# Patient Record
Sex: Female | Born: 1965 | Race: White | Hispanic: No | Marital: Married | Smoking: Never smoker
Health system: Southern US, Community
[De-identification: ages and names within clinical notes are randomized; demographics above are authoritative.]

## PROBLEM LIST (undated history)

## (undated) ENCOUNTER — Emergency Department (HOSPITAL_COMMUNITY): Admission: EM | Payer: Self-pay | Source: Home / Self Care

## (undated) DIAGNOSIS — M549 Dorsalgia, unspecified: Secondary | ICD-10-CM

## (undated) DIAGNOSIS — C649 Malignant neoplasm of unspecified kidney, except renal pelvis: Secondary | ICD-10-CM

## (undated) DIAGNOSIS — G8929 Other chronic pain: Secondary | ICD-10-CM

---

## 2012-07-15 ENCOUNTER — Emergency Department (HOSPITAL_COMMUNITY)
Admission: EM | Admit: 2012-07-15 | Discharge: 2012-07-15 | Disposition: A | Discharge status: Home / Self Care | Payer: No Typology Code available for payment source | Attending: Emergency Medicine | Admitting: Emergency Medicine

## 2012-07-15 ENCOUNTER — Encounter (HOSPITAL_COMMUNITY): Payer: Self-pay | Admitting: Emergency Medicine

## 2012-07-15 ENCOUNTER — Emergency Department (HOSPITAL_COMMUNITY): Payer: No Typology Code available for payment source

## 2012-07-15 DIAGNOSIS — G8929 Other chronic pain: Secondary | ICD-10-CM | POA: Insufficient documentation

## 2012-07-15 DIAGNOSIS — M549 Dorsalgia, unspecified: Secondary | ICD-10-CM | POA: Insufficient documentation

## 2012-07-15 DIAGNOSIS — Y9389 Activity, other specified: Secondary | ICD-10-CM | POA: Insufficient documentation

## 2012-07-15 DIAGNOSIS — S139XXA Sprain of joints and ligaments of unspecified parts of neck, initial encounter: Secondary | ICD-10-CM | POA: Insufficient documentation

## 2012-07-15 DIAGNOSIS — Z79899 Other long term (current) drug therapy: Secondary | ICD-10-CM | POA: Insufficient documentation

## 2012-07-15 DIAGNOSIS — Y92410 Unspecified street and highway as the place of occurrence of the external cause: Secondary | ICD-10-CM | POA: Insufficient documentation

## 2012-07-15 HISTORY — DX: Dorsalgia, unspecified: M54.9

## 2012-07-15 HISTORY — DX: Other chronic pain: G89.29

## 2012-07-15 MED ORDER — METHOCARBAMOL 500 MG PO TABS: 500.0000 mg | ORAL_TABLET | Freq: Two times a day (BID) | ORAL | Status: AC

## 2012-07-15 MED ORDER — TRAMADOL HCL 50 MG PO TABS: 50.0000 mg | ORAL_TABLET | Freq: Four times a day (QID) | ORAL | Status: AC | PRN

## 2012-07-15 NOTE — ED Notes (Signed)
[id number]> Expected date:<BR> Expected time:<BR> Means of arrival:<BR> Comments:<BR> Pt in room
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
## 2012-07-16 NOTE — ED Provider Notes (Signed)
Medical screening examination/treatment/procedure(s) were performed by non-physician practitioner and as supervising physician I was immediately available for consultation/collaboration.   Whitney Plunkett, MD 07/16/12 0016 

## 2018-04-14 ENCOUNTER — Ambulatory Visit: Payer: Self-pay

## 2018-04-14 ENCOUNTER — Ambulatory Visit: Payer: BLUE CROSS/BLUE SHIELD | Admitting: Family Medicine

## 2018-04-14 VITALS — BP 132/86 | Ht 63.5 in | Wt 158.0 lb

## 2018-04-14 DIAGNOSIS — M79672 Pain in left foot: Secondary | ICD-10-CM

## 2018-04-14 MED ORDER — GABAPENTIN 100 MG PO CAPS: 100.0000 mg | ORAL_CAPSULE | Freq: Every day | ORAL | 0 refills | Status: DC

## 2018-04-16 DIAGNOSIS — M79672 Pain in left foot: Secondary | ICD-10-CM | POA: Insufficient documentation

## 2018-04-16 NOTE — Assessment & Plan Note (Signed)
Chronic issue, has had pain for months. Seems to be some component of joint pain as well as plantar fascia pain but diagnosis is not exactly clear to me Will start with immobilisation in cam walker and f/u 2 weeks

## 2018-04-16 NOTE — Progress Notes (Signed)
    CHIEF COMPLAINT / HPI:  Left foot, especially heel area, pain for several months. Has used OTC brace, ice, exercises with no result. 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 Right foot is somewhat painful but left is worst. No known injury. No prior issues with feet.  REVIEW OF SYSTEMS: No unusual weight change. No foot numbness. No skin rash. No feber  PERTINENT  PMH / PSH: I have reviewed the patient's medications, allergies, past medical and surgical history, smoking status and updated in the EMR as appropriate. No personal hx of DM No prior foot surgery Never smoker OBJECTIVE:  WD WN NAD FEET:  B pes planus. Left foot ttp over lateral heel.  ANKLE: B from with good endpoint on anterior drawer. Some pain with eversion and inversion resistance on left ankle NEURO: intact sensation to soft touch B feet VASC: DP and PT pulses 2+ bilaterally equal.  ASSESSMENT / PLAN:  No problem-specific Assessment & Plan notes found for this encounter.

## 2018-04-28 ENCOUNTER — Encounter (INDEPENDENT_AMBULATORY_CARE_PROVIDER_SITE_OTHER): Payer: Self-pay

## 2018-04-28 ENCOUNTER — Ambulatory Visit: Payer: BLUE CROSS/BLUE SHIELD | Admitting: Family Medicine

## 2018-04-28 ENCOUNTER — Encounter: Payer: Self-pay | Admitting: Family Medicine

## 2018-04-28 DIAGNOSIS — M79672 Pain in left foot: Secondary | ICD-10-CM

## 2018-04-28 MED ORDER — METHYLPREDNISOLONE ACETATE 40 MG/ML IJ SUSP
40.0000 mg | Freq: Once | INTRAMUSCULAR | Status: AC
  Administered 2018-04-28: 40 mg via INTRA_ARTICULAR

## 2018-04-28 MED ORDER — AMITRIPTYLINE HCL 10 MG PO TABS
10.0000 mg | ORAL_TABLET | Freq: Every day | ORAL | 0 refills | Status: DC
Start: 2018-04-28 — End: 2018-05-12

## 2018-04-28 NOTE — Progress Notes (Signed)
[facility] Attending Note: I have seen and examined this patient. I have discussed this patient with the resident and reviewed the assessment and plan as documented above. I agree with the resident's findings and plan. Pain not much better with 1 week of wearing a Cam walker boot.  Diagnosis still somewhat unclear to me.  We will try corticosteroid injection and amitriptyline.  If this does not improve her symptoms with continued immobilization, will probably need to get an MRI.

## 2018-04-28 NOTE — Progress Notes (Signed)
   CC: L foot pain fu  HPI  Left foot pain: She reports minimal change with a cam walker, wearing as directed.  States that her pain is mostly after standing a lot at her job, as this is been going on about 3 months without any acute injury at the onset.  She thinks maybe she started having pain while she is in physical therapy after bladder sling removal procedure.  She does not have significant pain when first standing in the morning.  She has tried tramadol and gabapentin and feels that neither are helpful for her.  She also uses ibuprofen which is helpful for a few hours in the morning.  It feels like a bruised type of feeling and radiates up her left ankle.  CC, SH/smoking status, and VS noted  Objective: BP 131/82   Ht 5' 3.5" (1.613 m)   Wt 158 lb (71.7 kg)   BMI 27.55 kg/m  Gen: NAD, alert, cooperative, and pleasant. L ankle: Normal strength with inversion eversion and flexion and extension.  No gross abnormalities or edema.  Pain to palpation of the lateral malleolus as well as base of the calcaneus, in addition to over the lateral midfoot.  Some pain with palpation of the plantar fascia as well. Neuro: Alert and oriented, Speech clear, No gross deficits  PROCEDURE: INJECTION: Patient was given informed consent, signed copy in the chart. Appropriate time out was taken. Area prepped and draped in usual sterile fashion. Ethyl chloride was  used for local anesthesia. A 21 gauge 1 1/2 inch needle was used.. 1 cc of methylprednisolone 40 mg/ml plus  1 cc of 1% lidocaine without epinephrine was injected into the plantar fascia using a(n) medial approach.   The patient tolerated the procedure well. There were no complications. Post procedure instructions were given.  Assessment and plan:  Left ankle pain: Continued curious etiology, we discussed with the patient the options of MRI for further information gathering, amitriptyline for possible complex regional pain syndrome, or injection of  the plantar fascia for refractory plantar fascial pain.  Patient is traveling to [place] in 1 week, and given this we will be more aggressive with her treatment.  She elects for amitriptyline as well as plantar fascial injection today.  She may also get an MRI while she is in [place], and she will bring the disc back with with her if she does this.  She should return after her trip if she has continued pain.  Meds ordered this encounter  Medications  . amitriptyline (ELAVIL) 10 MG tablet    Sig: Take 1 tablet (10 mg total) by mouth at bedtime.    Dispense:  90 tablet    Refill:  0  . methylPREDNISolone acetate (DEPO-MEDROL) injection 40 mg     Loni MuseKate Timberlake, MD, PGY3 04/28/2018 11:44 AM

## 2018-05-11 ENCOUNTER — Other Ambulatory Visit: Payer: Self-pay | Admitting: Family Medicine

## 2018-05-12 ENCOUNTER — Ambulatory Visit: Payer: Self-pay

## 2018-05-12 ENCOUNTER — Ambulatory Visit (INDEPENDENT_AMBULATORY_CARE_PROVIDER_SITE_OTHER): Payer: BLUE CROSS/BLUE SHIELD | Admitting: Sports Medicine

## 2018-05-12 VITALS — BP 120/88 | Ht 63.5 in | Wt 155.0 lb

## 2018-05-12 DIAGNOSIS — M79672 Pain in left foot: Secondary | ICD-10-CM

## 2018-05-12 MED ORDER — MELOXICAM 15 MG PO TABS: 15.0000 mg | ORAL_TABLET | Freq: Every day | ORAL | 3 refills | Status: AC | PRN

## 2018-05-12 MED ORDER — AZITHROMYCIN 500 MG PO TABS: ORAL_TABLET | ORAL | 0 refills | Status: AC

## 2018-05-12 NOTE — Progress Notes (Addendum)
HPI  CC: Left foot pain  Jasmine HoughShahida is a 53 year old female presents with left foot pain.  She was last seen on 04/28/2018.  At that time she got an injection into her left plantar fascia.  She states this did help somewhat with her plantar fascial pain.  She now has pain on the lateral side of her heel, which she state was present at her last visit.  She states that pain is now more noticeable.  She notes that anytime she walks for any prolonged period time.  She notices it whenever she takes a first step in the morning.  She states she does have some pain or metatarsal heads still.  She also reports of pain that radiates up the lateral side of her foot behind the lateral malleoli.  She denies any numbness and tingling.  She denies any weakness of the foot.  She has been wearing a boot which does not seem to help much.  She is taken 800 mg of Tylenol daily which seems to help somewhat.  She has not tried any padding in her shoes.  She denies any new trauma to the area.  She has not been doing any home exercises.  See HPI and/or previous note for associated ROS.  Objective: BP 120/88   Ht 5' 3.5" (1.613 m)   Wt 155 lb (70.3 kg)   BMI 27.03 kg/m  Gen:NAD, well groomed, a/o x3, normal affect.  CV: Well-perfused. Warm.  Resp: Non-labored.  Neuro: Sensation intact throughout. No gross coordination deficits.  Gait: Nonpathologic posture, unremarkable stride without signs of limp or balance issues.  Left foot exam: No erythema, warmth, swelling noted.  Tenderness palpation over the posterior aspect of the lateral malleoli.  Tenderness palpation over the lateral heel pad.  Tenderness palpation over the base of the fifth metatarsal.  Full range of motion dorsiflexion, plantarflexion, eversion, inversion.  Some pain with dorsiflexion.  Strength 5 5 throughout testing.  Negative anterior drawer test.  ULTRASOUND: Ankle, left Diagnostic limited ultrasound imaging obtained of patient's left ankle.  -  Lateral tendons: Peroneal tendons observed with evidence of edema.  No obvious tears noted.  No hypoechoic changes noted.. Peroneal tubercle noted and normal.  IMPRESSION: findings consistent with peroneal tendinitis.  Assessment and plan:  1.  Left lateral column overload, likely secondary to underlying plantar fasciitis. 2.  Left-sided plantar fasciitis, improving 3.  Peroneal tendinitis, left side  We discussed treatment options at today's visit.  I will switch her to meloxicam 15 mg daily at this time.  She is leaving for [place] tomorrow for 2 and half weeks.  I will also give her a lateral heel wedge to put in her shoes.  This will help offload the lateral column.  She has a walking boot already from last visit, and I advised her to take this with her she needs it while in [place].  We will see her back when she returns.  At that time she should bring all of her shoes as we discussed the plan for making custom orthotics versus padding of the shoes.  I also provided her with 3 days of azithromycin 500 mg, in case she needs it while in [place].  She has a history of traveler's diarrhea and is unable to get a hold of her primary care before leaving tomorrow.  Alric QuanBlake Mayola Mcbain, MD [facility] Sports Medicine Fellow 05/12/2018 9:29 AM   Patient seen and evaluated with the sports medicine fellow.  I agree with the  above plan of care.  Follow-up in 3 to 4 weeks when returning from [place].

## 2018-05-12 NOTE — Patient Instructions (Signed)
Thank you for coming to see Jasmine Horn today in clinic.  You were seen for left foot pain.  We started you on meloxicam 15 mg at today's visit.  We also gave you some heel wedges to wear new shoes while you are in [place].  We will see for follow-up when she returns from her vacation.

## 2018-06-09 ENCOUNTER — Ambulatory Visit: Payer: BLUE CROSS/BLUE SHIELD | Admitting: Family Medicine

## 2018-07-07 ENCOUNTER — Encounter: Payer: Self-pay | Admitting: Family Medicine

## 2018-07-07 ENCOUNTER — Ambulatory Visit: Payer: BLUE CROSS/BLUE SHIELD | Admitting: Family Medicine

## 2018-07-07 DIAGNOSIS — M79672 Pain in left foot: Secondary | ICD-10-CM

## 2018-07-07 NOTE — Progress Notes (Signed)
[facility]: Attending Note: I have reviewed the chart, discussed wit the Sports Medicine Fellow. I agree with assessment and treatment plan as detailed in the Fellow's note. I agree with MRI as conservative therapy has had no significant effect on her pain.

## 2018-07-07 NOTE — Assessment & Plan Note (Signed)
-  Unclear etiology of her symptoms, differential diagnosis: Chronic plantar fasciitis, chronic peroneal tendinitis, posterior tibial tendon dysfunction, tarsal tunnel syndrome, bony edema of the calcaneus, or alternative etiology -Given that the patient has failed conservative measures for approximately 6 months including: Exercises, multiple medications including anti-inflammatories and amitriptyline, and a corticosteroid injection of her plantar fascia, I am recommending pursuing an MRI to further delineate the etiology of her pain -Temporary orthotics size 7-1/2-8-1/2 were given today with small scaphoid pads.  This will provide medial longitudinal arch support for her. -Two small scaphoid pads were placed in her normal walking shoes as well -Follow-up in 1 week with the results of the MRI -She is a candidate for custom orthotics.  If she likes her temporary orthotics we will consider making her custom ones in the future.

## 2018-07-07 NOTE — Progress Notes (Signed)
Ellea Rodriges - 53 y.o. female MRN [id number]  Date of birth: 12/14/1965   Chief complaint: L plantar foot pain  SUBJECTIVE:    History of present illness: 53 year old female that presents today for follow-up of ongoing foot pain.  She has been seen both in December and January for this problem and has had several different treatment modalities for this.  Back in December, it was thought that her pain was due to plantar fasciitis and she received a cortisone injection of this area.  She reports no significant relief with the cortisone injection.  She was given plantar fascia stretches and gastrocnemius strengthening exercises but continues to have ongoing pain.  She is also evaluated in January where an ultrasound demonstrated mild swelling around her peroneal tendons however no discrete tears.  She was placed on meloxicam as an anti-inflammatory however reports no significant relief in her symptoms.  Today she states her pain is 9 out of 10 and is worse with ambulation first thing in the morning.  She localizes it to her inferior medial calcaneus.  It does seem to loosen up throughout the day however in the morning it is nearly unbearable.  She has been avoiding ambulating long distances secondary to this pain.  She recently traveled to [place] and actually had to have a wheelchair throughout the airport secondary to inability walking.  She also has tried compression stockings which have not worked.  Furthermore, she has failed amitriptyline therapy given her pain and sensation of burning in her entire foot.  Today she does endorse she gets a burning sensation throughout the entire dorsum of her foot.  It does completely resolve however it is intermittent and unpredictable.  She denies any significant ankle tenderness or knee pain.  The pain does occasionally wake her up at night.  She denies any weakness of her extremity.  No injury or trauma to the area.  No new footwear.  She has not recently done  increasing activity.   Review of systems:  As stated above   Interval past medical history, surgical history, family history, and social history obtained and are unchanged.   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  Of note she has a history of chronic low back pain and takes Ultram for this.  He is a non-smoker.  Medications reviewed and unchanged.  Of note, she takes Ultram, meloxicam, gabapentin, Zyrtec, Allegra. Allergies reviewed and unchanged.  Of note, she has intolerance to amitriptyline and an allergy to cinnamon.  OBJECTIVE:  Physical exam: Vital signs are reviewed. BP 127/83   Ht 5\' 3"  (1.6 m)   Wt 155 lb (70.3 kg)   BMI 27.46 kg/m   Gen.: Alert, oriented, appears stated age, in no apparent distress HEENT: Moist oral mucosa Respiratory: Normal respirations, able to speak in full sentences Cardiac: Regular rate, right radial pulses +2 Integumentary: No rashes or skin lesions noted.  No excess callus formation of her feet. Neurologic:  Sensation is intact to light touch L4-S1.  Discomfort elicited with Tinel's over the tarsal tunnel. Gait: Antalgic primarily weightbearing on her hindfoot on the left side with an associated limp. Psych: Normal affect, mood is described as good.  Very pleasant Musculoskeletal: Inspection of the left foot demonstrates no obvious swelling or deformity.  She does have significant pes planus with subtalar medial translation with weightbearing.  She has tenderness palpation in her medial longitudinal arch.  She also has tenderness to palpation over her medial calcaneus at the  referred pain site of plantar fasciitis.  No Achilles tenderness or palpable defects.  No tight gastrocnemius muscles.  Full range of motion ankle dorsiflexion plantarflexion.  Strength testing 5 out of 5 in ankle dorsiflexion plantarflexion.  Pain is elicited with the toe off phase of ambulation indicating posterior tibialis tendon weakness.  Negative anterior drawer.   Negative ankle inversion and eversion testing.  Dorsalis pedis pulse +2.    ASSESSMENT & PLAN: Left foot pain -Unclear etiology of her symptoms, differential diagnosis: Chronic plantar fasciitis, chronic peroneal tendinitis, posterior tibial tendon dysfunction, tarsal tunnel syndrome, bony edema of the calcaneus, or alternative etiology -Given that the patient has failed conservative measures for approximately 6 months including: Exercises, multiple medications including anti-inflammatories and amitriptyline, and a corticosteroid injection of her plantar fascia, I am recommending pursuing an MRI to further delineate the etiology of her pain -Temporary orthotics size 7-1/2-8-1/2 were given today with small scaphoid pads.  This will provide medial longitudinal arch support for her. -Two small scaphoid pads were placed in her normal walking shoes as well -Follow-up in 1 week with the results of the MRI -She is a candidate for custom orthotics.  If she likes her temporary orthotics we will consider making her custom ones in the future.   Orders Placed This Encounter  Procedures  . MR FOOT LEFT WO CONTRAST    Ins bcbs Ht 5'3/Wt 158 No claus/no metal eyes by dr/no shrapnel/no sx left foot, brain, heart, eyes or ears/no implants/no needs/scheduled w patient/taron  Epic order    Standing Status:   Future    Standing Expiration Date:   09/05/2019    Order Specific Question:   What is the patient's sedation requirement?    Answer:   No Sedation    Order Specific Question:   Does the patient have a pacemaker or implanted devices?    Answer:   No    Order Specific Question:   Preferred imaging location?    Answer:   [facility] (table limit-550lbs)    Order Specific Question:   Radiology Contrast Protocol - do NOT remove file path    Answer:   [email or web address]     Gustavus Messing, DO Sports Medicine Fellow [facility]

## 2018-07-12 ENCOUNTER — Telehealth: Payer: Self-pay | Admitting: Family Medicine

## 2018-07-12 ENCOUNTER — Ambulatory Visit
Admission: RE | Admit: 2018-07-12 | Discharge: 2018-07-12 | Disposition: A | Discharge status: Home / Self Care | Payer: BLUE CROSS/BLUE SHIELD | Source: Ambulatory Visit | Attending: Family Medicine | Admitting: Family Medicine

## 2018-07-12 DIAGNOSIS — M79672 Pain in left foot: Secondary | ICD-10-CM

## 2018-07-12 NOTE — Telephone Encounter (Signed)
Jasmine Horn, please let her know that I reviewed her MRI.  She has a small lesion of the talar dome needs to be evaluated by ortho. Not sure if that is contributing to her pain or not, but that definitely needs to be seen by ortho.  I would rec her  to see Dr. Susa Simmonds at [facility] orthopedics.    She has some plantar fasciitis and some tendinitis as well.  Not sure why this is not gotten better with conservative therapy but at this point I think she needs to see the orthopedist anyway.  See if she is okay with that, otherwise I can call her.

## 2018-07-13 NOTE — Telephone Encounter (Signed)
Spoke with patient about her results. She is in agreement with the plan. However, when I tried to schedule an appt with Dr. Susa Simmonds I discovered his office does not accept her insurance. She has the [employer] plan with [facility] so she can only go to a [facility]. Is there a provider with [facility] you'd like her to see? Please advise.

## 2018-07-13 NOTE — Telephone Encounter (Signed)
Megan Well that is unfortunate. I am not sure who their foot and ankle specialists are now so whomovere. THANKS! Denny Levy

## 2019-02-15 ENCOUNTER — Other Ambulatory Visit: Payer: Self-pay

## 2019-02-15 DIAGNOSIS — Z20822 Contact with and (suspected) exposure to covid-19: Secondary | ICD-10-CM

## 2019-02-16 LAB — NOVEL CORONAVIRUS, NAA: SARS-CoV-2, NAA: NOT DETECTED

## 2020-04-08 ENCOUNTER — Other Ambulatory Visit: Payer: Self-pay | Admitting: Specialist

## 2020-04-08 DIAGNOSIS — M545 Low back pain, unspecified: Secondary | ICD-10-CM

## 2020-04-08 DIAGNOSIS — G8929 Other chronic pain: Secondary | ICD-10-CM

## 2020-04-09 ENCOUNTER — Telehealth: Payer: Self-pay

## 2020-04-09 NOTE — Telephone Encounter (Signed)
Phone call to patient to verify medication list and allergies for myelogram procedure. Pt instructed to hold Lexapro and Trazodone for 48hrs prior to myelogram appointment time and 24 hours after appointment. Pt also instructed to have a driver the day of the procedure, the procedure would take around 2 hours, and discharge instructions discussed. Pt verbalized understanding.  

## 2020-04-15 ENCOUNTER — Ambulatory Visit
Admission: RE | Admit: 2020-04-15 | Discharge: 2020-04-15 | Disposition: A | Discharge status: Home / Self Care | Payer: Self-pay | Source: Ambulatory Visit | Attending: Specialist | Admitting: Specialist

## 2020-04-15 ENCOUNTER — Other Ambulatory Visit: Payer: Self-pay | Admitting: Specialist

## 2020-04-15 DIAGNOSIS — M545 Low back pain, unspecified: Secondary | ICD-10-CM

## 2020-04-16 ENCOUNTER — Ambulatory Visit
Admission: RE | Admit: 2020-04-16 | Discharge: 2020-04-16 | Disposition: A | Discharge status: Home / Self Care | Payer: No Typology Code available for payment source | Source: Ambulatory Visit | Attending: Specialist | Admitting: Specialist

## 2020-04-16 ENCOUNTER — Ambulatory Visit
Admission: RE | Admit: 2020-04-16 | Discharge: 2020-04-16 | Disposition: A | Discharge status: Home / Self Care | Payer: Self-pay | Source: Ambulatory Visit | Attending: Specialist | Admitting: Specialist

## 2020-04-16 ENCOUNTER — Other Ambulatory Visit: Payer: Self-pay

## 2020-04-16 DIAGNOSIS — G8929 Other chronic pain: Secondary | ICD-10-CM

## 2020-04-16 DIAGNOSIS — M545 Low back pain, unspecified: Secondary | ICD-10-CM

## 2020-04-16 MED ORDER — MEPERIDINE HCL 50 MG/ML IJ SOLN
50.0000 mg | Freq: Once | INTRAMUSCULAR | Status: AC
  Administered 2020-04-16: 50 mg via INTRAMUSCULAR

## 2020-04-16 MED ORDER — ONDANSETRON HCL 4 MG/2ML IJ SOLN
4.0000 mg | Freq: Once | INTRAMUSCULAR | Status: AC
Start: 2020-04-16 — End: 2020-04-16
  Administered 2020-04-16: 4 mg via INTRAMUSCULAR

## 2020-04-16 MED ORDER — DIAZEPAM 5 MG PO TABS
10.0000 mg | ORAL_TABLET | Freq: Once | ORAL | Status: AC
  Administered 2020-04-16: 10 mg via ORAL

## 2020-04-16 MED ORDER — IOPAMIDOL (ISOVUE-M 200) INJECTION 41%
20.0000 mL | Freq: Once | INTRAMUSCULAR | Status: AC
  Administered 2020-04-16: 20 mL via INTRATHECAL

## 2020-04-16 NOTE — Discharge Instr - Other Orders (Addendum)
1020 pt c/o 10/10 pain bilat lower extremities from procedure. Post procedure pt had no complaints of pain

## 2020-04-16 NOTE — Progress Notes (Signed)
Pt reported she stopped Lexapro and trazodone at least 48 hours ago

## 2020-04-16 NOTE — Discharge Instructions (Signed)
Myelogram Discharge Instructions  1. Go home and rest quietly for the next 24 hours.  It is important to lie flat for the next 24 hours.  Get up only to go to the restroom.  You may lie in the bed or on a couch on your back, your stomach, your left side or your right side.  You may have one pillow under your head.  You may have pillows between your knees while you are on your side or under your knees while you are on your back.  2. DO NOT drive today.  Recline the seat as far back as it will go, while still wearing your seat belt, on the way home.  3. You may get up to go to the bathroom as needed.  You may sit up for 10 minutes to eat.  You may resume your normal diet and medications unless otherwise indicated.  Drink lots of extra fluids today and tomorrow.  4. The incidence of headache, nausea, or vomiting is about 5% (one in 20 patients).  If you develop a headache, lie flat and drink plenty of fluids until the headache goes away.  Caffeinated beverages may be helpful.  If you develop severe nausea and vomiting or a headache that does not go away with flat bed rest, call [phone].  5. You may resume normal activities after your 24 hours of bed rest is over; however, do not exert yourself strongly or do any heavy lifting tomorrow. If when you get up you have a headache when standing, go back to bed and force fluids for another 24 hours.  6. Call your physician for a follow-up appointment.  The results of your myelogram will be sent directly to your physician by the following day.  7. If you have any questions or if complications develop after you arrive home, please call [phone].  Discharge instructions have been explained to the patient.  The patient, or the person responsible for the patient, fully understands these instructions  YOU MAY RESUME YOUR LEXAPRO AND TRAZODONE TOMORROW 04/17/20 AT 9:30 AM

## 2022-02-01 IMAGING — XA DG MYELOGRAPHY LUMBAR INJ LUMBOSACRAL
14 of 20 series · 14 of 20 positions shown · non-contrast
Comparison: MRI lumbar spine dated January 15, 2020, and [facility]

CLINICAL DATA: Chronic bilateral buttock pain radiating into the
left leg for the past 2 years. Prior surgery in 9006.
TECHNIQUE: Contiguous axial images were obtained through the lumbar spine after
the intrathecal infusion of contrast. Coronal and sagittal
reconstructions were obtained of the axial image sets.

[Series 1: w lumbar spine lat · 0.15mm/px · 1 of 1 slices shown]
[im 1/1]
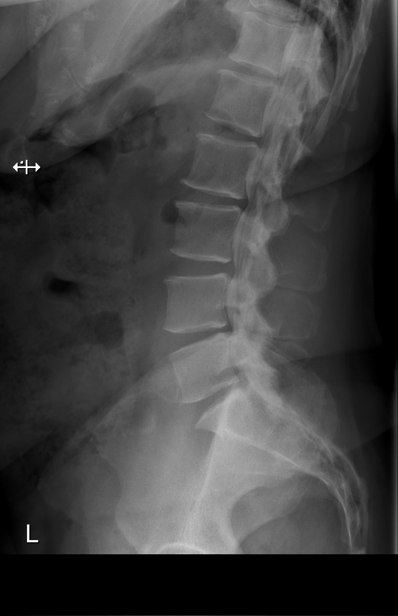

[Series 2: w lumbar spine flexion · 0.15mm/px · 1 of 1 slices shown]
[im 1/1]
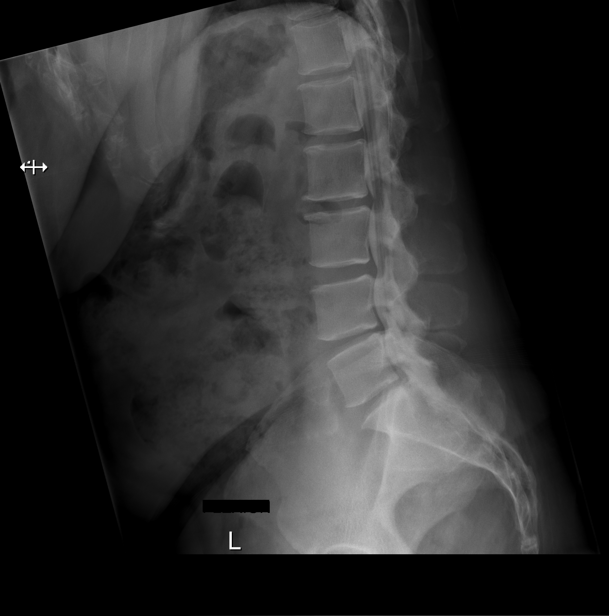

[Series 2: vasc standard · 1 of 1 slices shown (1 of 9)]
[im 1/1]
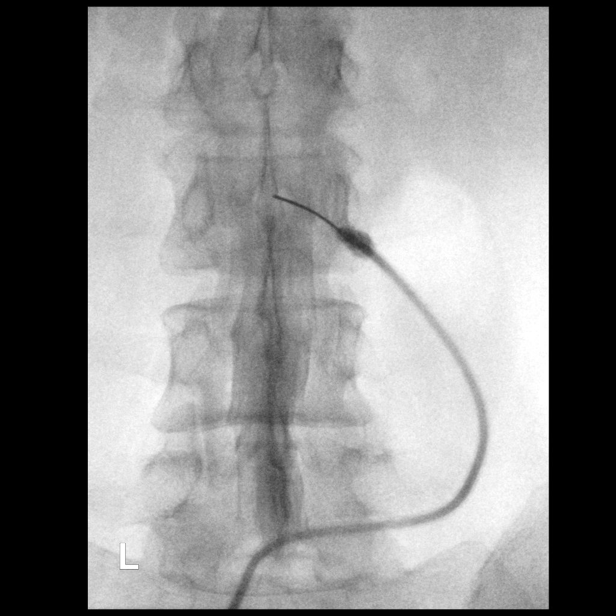

[Series 3: vasc standard · 1 of 1 slices shown (2 of 9)]
[im 1/1]
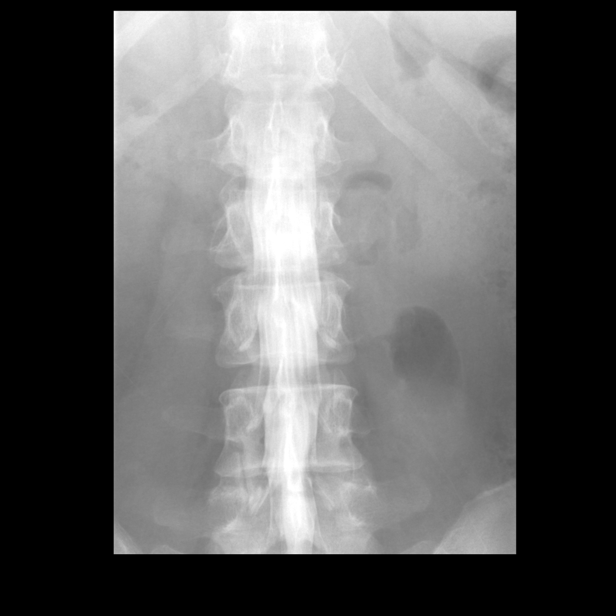

[Series 4: vasc standard · 1 of 1 slices shown (3 of 9)]
[im 1/1]
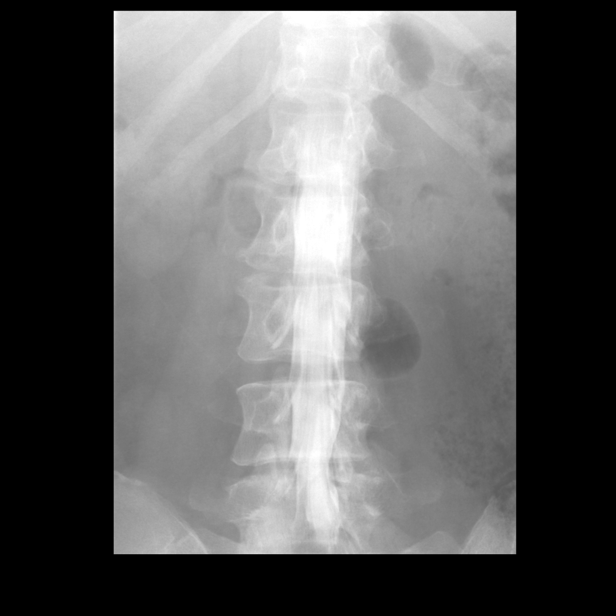

[Series 4: w lumbar spine ap · 0.15mm/px · 1 of 1 slices shown (1 of 3)]
[im 1/1]
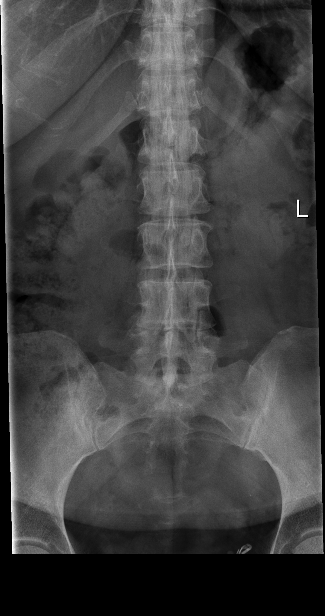

[Series 6: w lumbar spine ap · 0.15mm/px · 1 of 1 slices shown (2 of 3)]
[im 1/1]
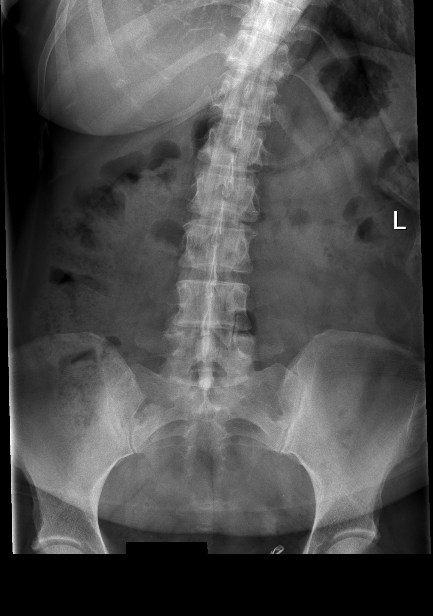

[Series 6: vasc standard · 1 of 1 slices shown (4 of 9)]
[im 1/1]
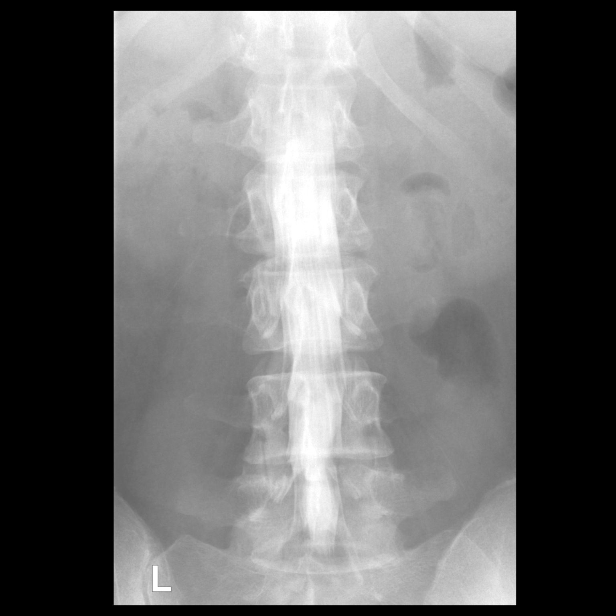

[Series 7: w lumbar spine ap · 0.15mm/px · 1 of 1 slices shown (3 of 3)]
[im 1/1]
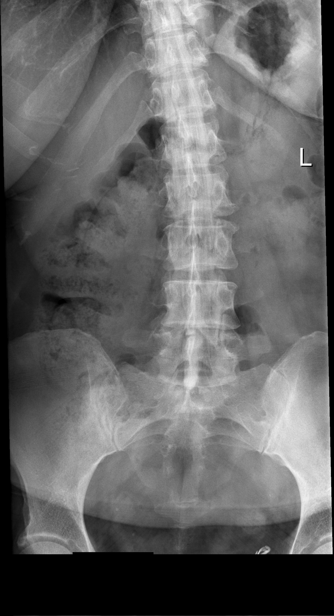

[Series 8: vasc standard · 1 of 1 slices shown (5 of 9)]
[im 1/1]
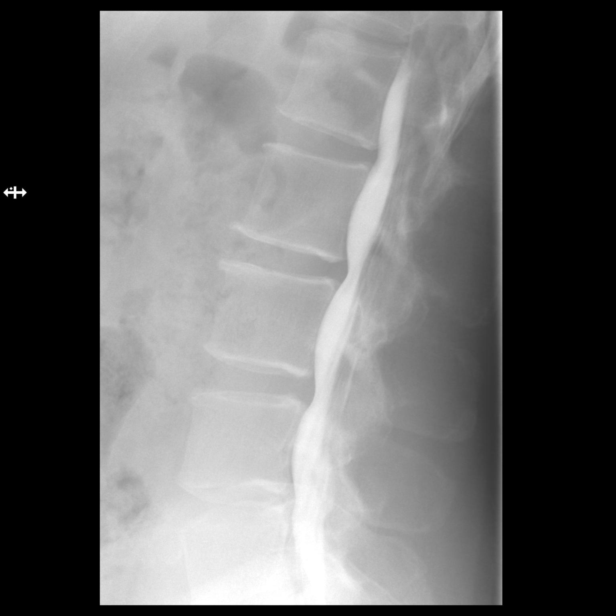

[Series 10: vasc standard · 1 of 1 slices shown (6 of 9)]
[im 1/1]
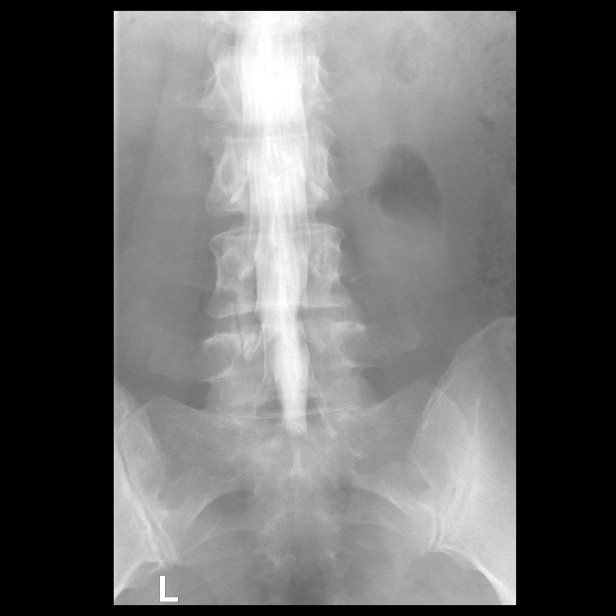

[Series 11: vasc standard · 1 of 1 slices shown (7 of 9)]
[im 1/1]
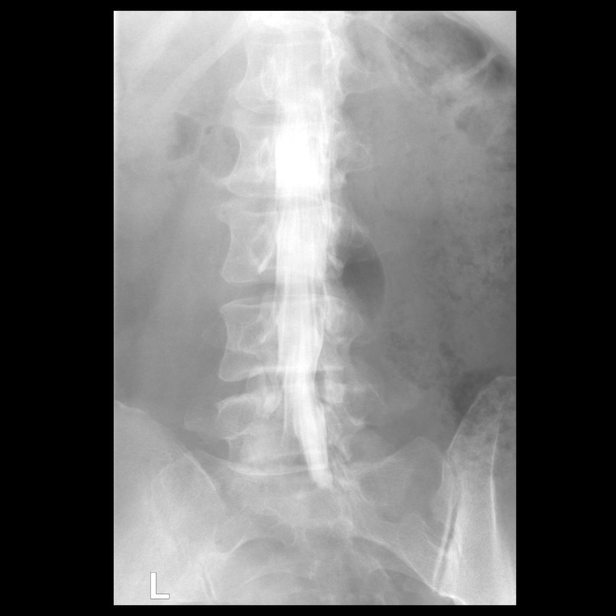

[Series 12: vasc standard · 1 of 1 slices shown (8 of 9)]
[im 1/1]
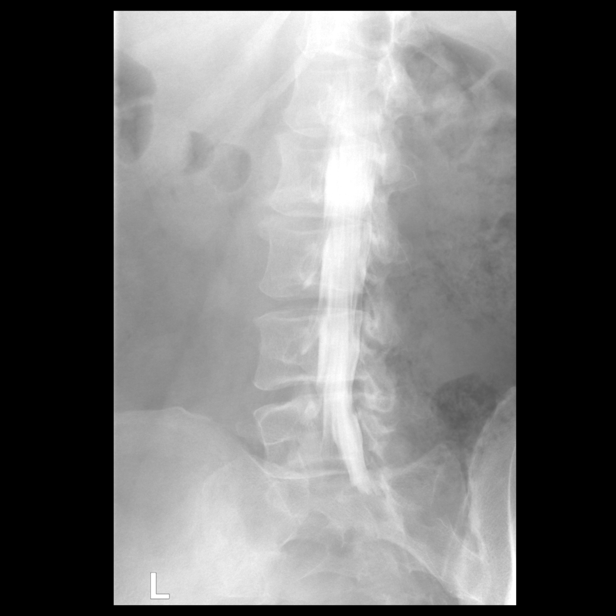

[Series 14: vasc standard · 1 of 1 slices shown (9 of 9)]
[im 1/1]
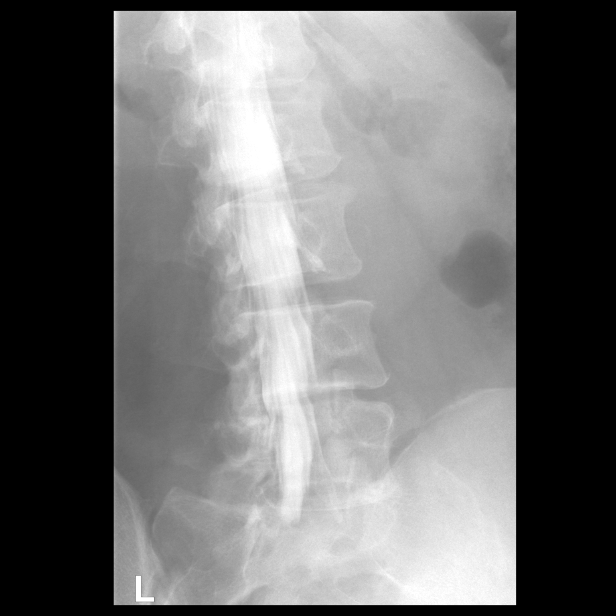

[14 of 20 positions shown; findings below may reference images not displayed]

EXAM:
LUMBAR MYELOGRAM

CT LUMBAR MYELOGRAM

FLUOROSCOPY TIME:  Radiation Exposure Index (as provided by the
fluoroscopic device): 10.6 mGy

Fluoroscopy Time:  35 seconds

Number of Acquired Images:  18

PROCEDURE:
After thorough discussion of risks and benefits of the procedure
including bleeding, infection, injury to nerves, blood vessels,
adjacent structures as well as headache and CSF leak, written and
oral informed consent was obtained. Consent was obtained by Dr.
Khoa Dajani. Time out form was completed.

Patient was positioned prone on the fluoroscopy table. Local
anesthesia was provided with 1% lidocaine without epinephrine after
prepped and draped in the usual sterile fashion. Puncture was
performed at L2-L3 using a 3 1/2 inch 22-gauge spinal needle via
right interlaminar approach. Using a single pass through the dura,
the needle was placed within the thecal sac, with return of clear
CSF. 15 mL of Isovue V-TVV was injected into the thecal sac, with
normal opacification of the nerve roots and cauda equina consistent
with free flow within the subarachnoid space.

I personally performed the lumbar puncture and administered the
intrathecal contrast. I also personally supervised acquisition of
the myelogram images.
FINDINGS: LUMBAR MYELOGRAM FINDINGS:

AP and sagittal alignment are normal. No dynamic instability. Small
ventral extradural defects from L1-L2 through L5-S1. No spinal canal
stenosis. Effacement of the right lateral recess at L4-L5 with
underfilling of the exiting right L5 nerve root.

CT LUMBAR MYELOGRAM FINDINGS:

Segmentation: Standard.

Alignment: Trace anterolisthesis at L2-L3.

Vertebrae: No acute finding or significant pathologic process.

Conus medullaris and cauda equina: Conus extends to the T12-L1
level. Conus and cauda equina appear normal.

Paraspinal and other soft tissues: Partially visualized 4.1 cm
cystic and solid mass in the left kidney (series 4, image 16). There
was a 1.5 cm cystic lesion in this region on prior MRI from June 2017.

Disc levels:

T11-T12: No significant disc bulge or herniation.  No stenosis.

T12-L1:  No significant disc bulge or herniation.  No stenosis.

L1-L2: Unchanged minimal disc bulging eccentric to the left. No
stenosis.

L2-L3: Unchanged mild disc bulging eccentric to the left. Unchanged
mild left lateral recess stenosis. No spinal canal or neuroforaminal
stenosis.

L3-L4:  Unchanged mild disc bulging.  No stenosis.

L4-L5: Prior left hemilaminectomy. Unchanged mild disc bulging and
endplate spurring with mild bilateral facet arthropathy. Unchanged
mild right lateral recess stenosis and mild left greater than right
neuroforaminal stenosis.

L5-S1: Unchanged small broad-based partially calcified posterior
disc protrusion and left foraminal disc osteophyte complex.
Unchanged mild right facet arthropathy. No stenosis.
IMPRESSION: 1. Partially visualized 4.1 cm cystic and solid mass in the left
kidney, concerning for renal cell carcinoma. Recommend further
evaluation with dedicated renal mass protocol CT or MRI.
2. Unchanged mild multilevel lumbar spondylosis as described above.
Unchanged mild left lateral recess stenosis at L2-L3.
3. Prior left hemilaminectomy at L4-L5 with unchanged mild right
lateral recess stenosis and mild left greater than right
neuroforaminal stenosis.

These results will be called to the ordering clinician or
representative by the Radiologist Assistant, and communication
documented in the PACS or [REDACTED].

## 2022-09-08 ENCOUNTER — Emergency Department (HOSPITAL_BASED_OUTPATIENT_CLINIC_OR_DEPARTMENT_OTHER): Payer: Medicare (Managed Care)

## 2022-09-08 ENCOUNTER — Emergency Department (HOSPITAL_BASED_OUTPATIENT_CLINIC_OR_DEPARTMENT_OTHER)
Admission: EM | Admit: 2022-09-08 | Discharge: 2022-09-08 | Disposition: A | Discharge status: Home / Self Care | Payer: Medicare (Managed Care) | Attending: Emergency Medicine | Admitting: Emergency Medicine

## 2022-09-08 ENCOUNTER — Other Ambulatory Visit: Payer: Self-pay

## 2022-09-08 ENCOUNTER — Encounter (HOSPITAL_BASED_OUTPATIENT_CLINIC_OR_DEPARTMENT_OTHER): Payer: Self-pay | Admitting: Emergency Medicine

## 2022-09-08 DIAGNOSIS — R000 Tachycardia, unspecified: Secondary | ICD-10-CM | POA: Diagnosis not present

## 2022-09-08 DIAGNOSIS — R062 Wheezing: Secondary | ICD-10-CM | POA: Diagnosis not present

## 2022-09-08 DIAGNOSIS — R0602 Shortness of breath: Secondary | ICD-10-CM | POA: Diagnosis not present

## 2022-09-08 DIAGNOSIS — Z79899 Other long term (current) drug therapy: Secondary | ICD-10-CM | POA: Insufficient documentation

## 2022-09-08 DIAGNOSIS — R072 Precordial pain: Secondary | ICD-10-CM | POA: Diagnosis present

## 2022-09-08 DIAGNOSIS — R079 Chest pain, unspecified: Secondary | ICD-10-CM

## 2022-09-08 HISTORY — DX: Malignant neoplasm of unspecified kidney, except renal pelvis: C64.9

## 2022-09-08 LAB — CBC
HCT: 43 % (ref 36.0–46.0)
Hemoglobin: 14.4 g/dL (ref 12.0–15.0)
MCH: 27.8 pg (ref 26.0–34.0)
MCHC: 33.5 g/dL (ref 30.0–36.0)
MCV: 83 fL (ref 80.0–100.0)
Platelets: 416 10*3/uL — ABNORMAL HIGH (ref 150–400)
RBC: 5.18 MIL/uL — ABNORMAL HIGH (ref 3.87–5.11)
RDW: 14.5 % (ref 11.5–15.5)
WBC: 10.8 10*3/uL — ABNORMAL HIGH (ref 4.0–10.5)
nRBC: 0 % (ref 0.0–0.2)

## 2022-09-08 LAB — LIPASE, BLOOD: Lipase: 60 U/L — ABNORMAL HIGH (ref 11–51)

## 2022-09-08 LAB — HEPATIC FUNCTION PANEL
ALT: 18 U/L (ref 0–44)
AST: 18 U/L (ref 15–41)
Albumin: 3.9 g/dL (ref 3.5–5.0)
Alkaline Phosphatase: 43 U/L (ref 38–126)
Bilirubin, Direct: <0.1 mg/dL (ref 0.0–0.2)
Total Bilirubin: 0.3 mg/dL (ref 0.3–1.2)
Total Protein: 6.8 g/dL (ref 6.5–8.1)

## 2022-09-08 LAB — TROPONIN I (HIGH SENSITIVITY)
Troponin I (High Sensitivity): 3 ng/L (ref <18)
Troponin I (High Sensitivity): 3 ng/L (ref <18)

## 2022-09-08 LAB — BASIC METABOLIC PANEL
Anion gap: 12 (ref 5–15)
BUN: 28 mg/dL — ABNORMAL HIGH (ref 6–20)
CO2: 18 mmol/L — ABNORMAL LOW (ref 22–32)
Calcium: 8.3 mg/dL — ABNORMAL LOW (ref 8.9–10.3)
Chloride: 102 mmol/L (ref 98–111)
Creatinine, Ser: 1.6 mg/dL — ABNORMAL HIGH (ref 0.44–1.00)
GFR, Estimated: 37 mL/min — ABNORMAL LOW (ref >60)
Glucose, Bld: 113 mg/dL — ABNORMAL HIGH (ref 70–99)
Potassium: 3.2 mmol/L — ABNORMAL LOW (ref 3.5–5.1)
Sodium: 132 mmol/L — ABNORMAL LOW (ref 135–145)

## 2022-09-08 MED ORDER — SODIUM CHLORIDE 0.9 % IV BOLUS
500.0000 mL | Freq: Once | INTRAVENOUS | Status: AC
  Administered 2022-09-08: 500 mL via INTRAVENOUS

## 2022-09-08 MED ORDER — METHYLPREDNISOLONE SODIUM SUCC 125 MG IJ SOLR
125.0000 mg | Freq: Once | INTRAMUSCULAR | Status: AC
  Administered 2022-09-08: 125 mg via INTRAVENOUS
  Filled 2022-09-08: qty 2

## 2022-09-08 MED ORDER — ALBUTEROL SULFATE HFA 108 (90 BASE) MCG/ACT IN AERS
2.0000 | INHALATION_SPRAY | Freq: Once | RESPIRATORY_TRACT | Status: AC
  Administered 2022-09-08: 2 via RESPIRATORY_TRACT
  Filled 2022-09-08: qty 6.7

## 2022-09-08 MED ORDER — PREDNISONE 20 MG PO TABS: 40.0000 mg | ORAL_TABLET | Freq: Every day | ORAL | 0 refills | Status: AC

## 2022-09-08 MED ORDER — IOHEXOL 350 MG/ML SOLN
60.0000 mL | Freq: Once | INTRAVENOUS | Status: AC | PRN
  Administered 2022-09-08: 60 mL via INTRAVENOUS

## 2022-09-08 NOTE — ED Triage Notes (Signed)
Pt c/o intermittent centralized chest pain radiating to left jaw since yesterday.  No sob, no issues with asthma.  No diaphoresis.  No N/V.  Pt states it feels like something is "stuck in her chest".

## 2022-09-08 NOTE — ED Provider Notes (Signed)
[facility] Provider Note   CSN: [id number] Arrival date & time: 09/08/22  1251     History  Chief Complaint  Patient presents with   Chest Pain    Jasmine Horn is a 57 y.o. female.  The history is provided by the patient, medical records and the spouse. No language interpreter was used.  Chest Pain Pain location:  Substernal area Pain quality: aching, pressure and sharp   Pain radiates to:  Neck Pain severity:  Moderate Onset quality:  Sudden Duration:  2 days Timing:  Constant Progression:  Waxing and waning Chronicity:  New Associated symptoms: cough and shortness of breath   Associated symptoms: no abdominal pain, no altered mental status, no anxiety, no back pain, no diaphoresis, no fatigue, no fever, no headache, no lower extremity edema, no nausea, no near-syncope, no palpitations, no vomiting and no weakness        Home Medications Prior to Admission medications   Medication Sig Start Date End Date Taking? Authorizing Provider  acetaminophen (TYLENOL) 500 MG tablet Take 500 mg by mouth every 6 (six) hours as needed.    [provider]  azithromycin (ZITHROMAX) 500 MG tablet Take 1 tablet daily for 3 days. Take only if you develop diarrhea while traveling. Patient not taking: Reported on 04/09/2020 05/12/18   Gerarda Gunther, MD  benzonatate (TESSALON) 100 MG capsule Take by mouth 3 (three) times daily as needed for cough. For 7 days    [provider]  cefdinir (OMNICEF) 300 MG capsule Take 300 mg by mouth 2 (two) times daily. For 10 days    [provider]  cetirizine (ZYRTEC) 10 MG tablet Take 10 mg by mouth daily.    [provider]  escitalopram (LEXAPRO) 20 MG tablet Take 20 mg by mouth daily.    [provider]  fexofenadine (ALLEGRA) 180 MG tablet Take 180 mg by mouth daily. Patient not taking: Reported on 04/09/2020    [provider]  gabapentin (NEURONTIN)  100 MG capsule TAKE 1 CAPSULE BY MOUTH AT BEDTIME Patient not taking: Reported on 04/09/2020 05/11/18   Nestor Ramp, MD  hydrOXYzine (ATARAX/VISTARIL) 25 MG tablet Take 25 mg by mouth 2 (two) times daily as needed. Take two tables    [provider]  levothyroxine (SYNTHROID) 75 MCG tablet Take 75 mcg by mouth daily before breakfast.    [provider]  lisinopril (ZESTRIL) 10 MG tablet Take 10 mg by mouth daily.    [provider]  meloxicam (MOBIC) 15 MG tablet Take 1 tablet (15 mg total) by mouth daily as needed for pain. Take with food. Patient not taking: Reported on 04/09/2020 05/12/18   Gerarda Gunther, MD  methocarbamol (ROBAXIN) 500 MG tablet Take 1 tablet (500 mg total) by mouth 2 (two) times daily. Patient not taking: Reported on 04/09/2020 07/15/12   Roxy Horseman, PA-C  omeprazole (PRILOSEC) 40 MG capsule Take 40 mg by mouth daily.    [provider]  traMADol (ULTRAM) 50 MG tablet Take 1 tablet (50 mg total) by mouth every 6 (six) hours as needed for pain. Patient not taking: Reported on 04/09/2020 07/15/12   Roxy Horseman, PA-C  traZODone (DESYREL) 50 MG tablet Take 50 mg by mouth at bedtime. Take two as needed for sleep    [provider]      Allergies    Amitriptyline, Cinnamon, and Methocarbamol    Review of Systems  Review of Systems  Constitutional:  Negative for chills, diaphoresis, fatigue and fever.  HENT:  Negative for congestion.   Eyes:  Negative for visual disturbance.  Respiratory:  Positive for cough, chest tightness, shortness of breath and wheezing.   Cardiovascular:  Positive for chest pain. Negative for palpitations, leg swelling and near-syncope.  Gastrointestinal:  Negative for abdominal pain, constipation, diarrhea, nausea and vomiting.  Genitourinary:  Negative for dysuria.  Musculoskeletal:  Negative for back pain, neck pain and neck stiffness.  Skin:  Negative for rash and wound.  Neurological:  Negative for  weakness, light-headedness and headaches.  Psychiatric/Behavioral:  Negative for agitation.   All other systems reviewed and are negative.   Physical Exam Updated Vital Signs BP 116/87 (BP Location: Right Arm)   Pulse (!) 114   Temp 97.9 F (36.6 C) (Oral)   Resp 20   Ht 5\' 3"  (1.6 m)   Wt 75.8 kg   LMP 07/04/2012   SpO2 98%   BMI 29.58 kg/m  Physical Exam Vitals and nursing note reviewed.  Constitutional:      General: She is not in acute distress.    Appearance: She is well-developed. She is not ill-appearing, toxic-appearing or diaphoretic.  HENT:     Head: Normocephalic and atraumatic.  Eyes:     Conjunctiva/sclera: Conjunctivae normal.  Cardiovascular:     Rate and Rhythm: Regular rhythm. Tachycardia present.     Heart sounds: Normal heart sounds. No murmur heard. Pulmonary:     Effort: Pulmonary effort is normal. No respiratory distress.     Breath sounds: Wheezing present. No decreased breath sounds, rhonchi or rales.  Chest:     Chest wall: No tenderness.  Abdominal:     Palpations: Abdomen is soft.     Tenderness: There is no abdominal tenderness.  Musculoskeletal:        General: No swelling.     Cervical back: Neck supple.     Right lower leg: No tenderness. No edema.     Left lower leg: No tenderness. No edema.  Skin:    General: Skin is warm and dry.     Capillary Refill: Capillary refill takes less than 2 seconds.     Findings: No erythema.  Neurological:     Mental Status: She is alert.  Psychiatric:        Mood and Affect: Mood normal.     ED Results / Procedures / Treatments   Labs (all labs ordered are listed, but only abnormal results are displayed) Labs Reviewed  BASIC METABOLIC PANEL - Abnormal; Notable for the following components:      Result Value   Sodium 132 (*)    Potassium 3.2 (*)    CO2 18 (*)    Glucose, Bld 113 (*)    BUN 28 (*)    Creatinine, Ser 1.60 (*)    Calcium 8.3 (*)    GFR, Estimated 37 (*)    All other  components within normal limits  CBC - Abnormal; Notable for the following components:   WBC 10.8 (*)    RBC 5.18 (*)    Platelets 416 (*)    All other components within normal limits  LIPASE, BLOOD - Abnormal; Notable for the following components:   Lipase 60 (*)    All other components within normal limits  HEPATIC FUNCTION PANEL  TROPONIN I (HIGH SENSITIVITY)  TROPONIN I (HIGH SENSITIVITY)    EKG EKG Interpretation  Date/Time:  Wednesday Sep 08 2022 13:00:10  EDT Ventricular Rate:  115 PR Interval:  144 QRS Duration: 74 QT Interval:  343 QTC Calculation: 475 R Axis:   84 Text Interpretation: Sinus tachycardia Consider right atrial enlargement Borderline T abnormalities, diffuse leads Confirmed by Ernie Avena ([id number]) on 09/08/2022 1:36:10 PM  Radiology CT Angio Chest PE W and/or Wo Contrast  Result Date: 09/08/2022 CLINICAL DATA:  Pulmonary embolism (PE) suspected, high prob History of cancer, pleuritic chest pain with shortness of breath, tachycardia. Rule out PE. Patient has had contrast several months ago despite having 1 kidney now. EXAM: CT ANGIOGRAPHY CHEST WITH CONTRAST TECHNIQUE: Multidetector CT imaging of the chest was performed using the standard protocol during bolus administration of intravenous contrast. Multiplanar CT image reconstructions and MIPs were obtained to evaluate the vascular anatomy. RADIATION DOSE REDUCTION: This exam was performed according to the departmental dose-optimization program which includes automated exposure control, adjustment of the mA and/or kV according to patient size and/or use of iterative reconstruction technique. CONTRAST:  60mL OMNIPAQUE IOHEXOL 350 MG/ML SOLN COMPARISON:  04/22/2022 FINDINGS: Cardiovascular: Satisfactory opacification of the pulmonary arteries to the segmental level. No evidence of pulmonary embolism. Thoracic aorta is normal in course and caliber. Normal heart size. No pericardial effusion. Mediastinum/Nodes: No enlarged  mediastinal, hilar, or axillary lymph nodes. Thyroid gland, trachea, and esophagus demonstrate no significant findings. 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 Lungs/Pleura: Lungs are clear. No pleural effusion or pneumothorax. Upper Abdomen: Stable cyst at the left hepatic dome. No acute abnormality within the included upper abdomen. Musculoskeletal: No chest wall abnormality. No acute or significant osseous findings. Review of the MIP images confirms the above findings. IMPRESSION: No evidence of pulmonary embolism or other acute intrathoracic process. Electronically Signed   By: Duanne Guess D.O.   On: 09/08/2022 16:36   DG Chest 2 View  Result Date: 09/08/2022 CLINICAL DATA:  Chest pain radiating to the left jaw since yesterday. EXAM: CHEST - 2 VIEW COMPARISON:  CT chest 04/22/2022 FINDINGS: Cardiac silhouette and mediastinal contours are within normal limits. The lungs are clear. No pleural effusion or pneumothorax. Mild multilevel degenerative disc changes of the upper thoracic spine. IMPRESSION: No active cardiopulmonary disease. Electronically Signed   By: Neita Garnet M.D.   On: 09/08/2022 13:33    Procedures Procedures    Medications Ordered in ED Medications  methylPREDNISolone sodium succinate (SOLU-MEDROL) 125 mg/2 mL injection 125 mg (125 mg Intravenous Given 09/08/22 1535)  albuterol (VENTOLIN HFA) 108 (90 Base) MCG/ACT inhaler 2 puff (2 puffs Inhalation Given 09/08/22 1530)  sodium chloride 0.9 % bolus 500 mL (500 mLs Intravenous New Bag/Given 09/08/22 1538)  iohexol (OMNIPAQUE) 350 MG/ML injection 60 mL (60 mLs Intravenous Contrast Given 09/08/22 1605)    ED Course/ Medical Decision Making/ A&P                             Medical Decision Making Amount and/or Complexity of Data Reviewed Labs: ordered. Radiology: ordered.  Risk Prescription drug management.    WGNFAOZ Kuriakose is a 57 y.o. female with a past medical history significant for renal cell carcinoma status post nephrectomy,  chronic back pain, and recent pneumonia who presents with 2 days of pleuritic chest pain and shortness of breath.  According to patient, 2 days ago she had some soreness in her upper chest and lower jaw that yesterday worsened sharply.  She reports it is a sharp pleuritic pain in her central chest that does not seem to radiate  now.  She reports no nausea, vomiting, diaphoresis but has shortness of breath with it.  She does report some wheezing but has no documented history of asthma that I can see.  She denies any leg pain or leg swelling and denies history of blood clots to her knowledge.  She denies any recent long travel.  She reports that her pain was up to an 8 out of 10 in severity but is now mild.  She is tachycardic and tachypneic but is not currently hypoxic.  Oxygen saturation was around 92% on room air during my evaluation.  She reports no abdominal pain, nausea, or vomiting.  Denies any trauma.  Denies rash to suggest shingles.  Denies any back or flank pain.  On exam, lungs had some wheezing but there were no rhonchi.  Chest was nontender negative wheezes or discomfort.  No murmur.  Abdomen nontender.  Good pulses in extremities.  Legs nontender nonedematous.  Patient otherwise well-appearing other than the tachycardia.  EKG does not show STEMI but does show tachycardia.  Given the patient's cancer history, pleuritic chest discomfort, tachycardia, and softer oxygen saturations, and somewhat concerning to rule out pulmonary embolism.  Patient does have a previous nephrectomy although it appears she has tolerated contrast several months ago and patient agrees to get a CT PE study if needed.  Patient have screening labs initially as well.  Patient had some workup in triage fluting a troponin that was initially negative.  Will trend.  Patient has creatinine of 1.6 with GFR over 30 at 37.  Will give some fluids and will order the CT PE study as her chest x-ray did not show pneumonia.  CBC shows mild  leukocytosis but no anemia.   Will get hepatic function lipase to make sure there is not a pleuritic chest discomfort coming from below the diaphragm.  Will ambulate with pulse oximetry as her symptoms are worsened with exertion and deep breathing.  Will make sure she does not get hypoxic.  Will give Solu-Medrol and albuterol as patient has wheezing and may have some degree of reactive airway disease exacerbated by the recent pneumonia that she completed steroids and antibiotics several days ago and the environmental pollens and changes.  Anticipate reassessment after workup to determine disposition.  6:44 PM Workup returned overall reassuring.  CT PE study showed no pneumonia, pneumothorax, effusion, or other acute abnormalities.  Troponin negative x 2.  Patient's wheezing resolved with the steroids and breathing treatment and she was able to ambulate without significant hypoxia.  Patient is well-appearing and appears better.  Clinically I suspect she had the residual inflammation in her lungs and wheezing from the recent pneumonia however as there is no pneumonia on imaging I will hold on new antibiotics and will give burst of steroids for the next few days and she will take the inhaler home with her.  Her white blood cell count was slightly elevated likely related to the recent steroid use.  Patient understands return precautions and follow-up instructions with PCP.  She no other questions or concerns and was discharged in good condition with improvement in symptoms.        Final Clinical Impression(s) / ED Diagnoses Final diagnoses:  Chest pain, unspecified type  Shortness of breath  Wheezes    Rx / DC Orders ED Discharge Orders          Ordered    predniSONE (DELTASONE) 20 MG tablet  Daily        09/08/22 1846  Clinical Impression: 1. Chest pain, unspecified type   2. Shortness of breath   3. Wheezes     Disposition: Discharge  Condition: Good  I have  discussed the results, Dx and Tx plan with the pt(& family if present). He/she/they expressed understanding and agree(s) with the plan. Discharge instructions discussed at great length. Strict return precautions discussed and pt &/or family have verbalized understanding of the instructions. No further questions at time of discharge.    New Prescriptions   PREDNISONE (DELTASONE) 20 MG TABLET    Take 2 tablets (40 mg total) by mouth daily for 4 days.    Follow Up: Kerin Salen, PA-C [address] [place] [address] [phone]     [facility] at [facility] [address] [id number] [address] [place] [address] [phone]        Gearldean Lomanto, Canary Brim, MD 09/08/22 [phone]

## 2022-09-08 NOTE — Discharge Instructions (Addendum)
Your history, exam, workup today were overall reassuring and ruling out emergent etiologies of your chest tightness and shortness of breath.  We did hear wheezing that we are able to treat with steroids and albuterol and that improved as did your symptoms.  Your CT scan did not show blood clot or residual pneumonia.  Your labs were similar to prior with slight elevation in white blood cell count likely due to recent steroid use.  Your kidney function is also slightly elevated so please increase your hydration at home.  The rest of your workup was reassuring.  We agreed together to discharge home with burst of steroids for the next 4 days starting tomorrow and call your doctor for close follow-up.  If any symptoms change or worsen acutely, please return to the nearest emergency department.
